# Patient Record
Sex: Male | Born: 1990 | Race: White | Hispanic: No | Marital: Single
Health system: Southern US, Community
[De-identification: ages and names within clinical notes are randomized; demographics above are authoritative.]

---
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
## 2012-01-07 ENCOUNTER — Other Ambulatory Visit: Payer: Self-pay | Admitting: Endocrinology

## 2012-01-07 ENCOUNTER — Ambulatory Visit
Admission: RE | Admit: 2012-01-07 | Discharge: 2012-01-07 | Disposition: A | Discharge status: Home / Self Care | Payer: 59 | Source: Ambulatory Visit | Attending: Endocrinology | Admitting: Endocrinology

## 2012-01-07 DIAGNOSIS — E041 Nontoxic single thyroid nodule: Secondary | ICD-10-CM

## 2016-07-15 ENCOUNTER — Other Ambulatory Visit: Payer: Self-pay | Admitting: Orthopaedic Surgery of the Spine

## 2016-07-15 DIAGNOSIS — M48062 Spinal stenosis, lumbar region with neurogenic claudication: Secondary | ICD-10-CM

## 2016-07-24 ENCOUNTER — Ambulatory Visit
Admission: RE | Admit: 2016-07-24 | Discharge: 2016-07-24 | Disposition: A | Discharge status: Home / Self Care | Payer: BLUE CROSS/BLUE SHIELD | Source: Ambulatory Visit | Attending: Orthopaedic Surgery of the Spine | Admitting: Orthopaedic Surgery of the Spine

## 2016-07-24 DIAGNOSIS — M48062 Spinal stenosis, lumbar region with neurogenic claudication: Secondary | ICD-10-CM

## 2016-07-24 MED ORDER — ONDANSETRON HCL 4 MG/2ML IJ SOLN: 4.0000 mg | Freq: Four times a day (QID) | INTRAMUSCULAR | Status: DC | PRN

## 2016-07-24 MED ORDER — DIAZEPAM 5 MG PO TABS
5.0000 mg | ORAL_TABLET | Freq: Once | ORAL | Status: AC
  Administered 2016-07-24: 5 mg via ORAL

## 2016-07-24 MED ORDER — IOPAMIDOL (ISOVUE-M 200) INJECTION 41%
18.0000 mL | Freq: Once | INTRAMUSCULAR | Status: AC
  Administered 2016-07-24: 15 mL via INTRATHECAL

## 2016-07-24 NOTE — Discharge Instructions (Signed)
Myelogram Discharge Instructions  1. Go home and rest quietly for the next 24 hours.  It is important to lie flat for the next 24 hours.  Get up only to go to the restroom.  You may lie in the bed or on a couch on your back, your stomach, your left side or your right side.  You may have one pillow under your head.  You may have pillows between your knees while you are on your side or under your knees while you are on your back.  2. DO NOT drive today.  Recline the seat as far back as it will go, while still wearing your seat belt, on the way home.  3. You may get up to go to the bathroom as needed.  You may sit up for 10 minutes to eat.  You may resume your normal diet and medications unless otherwise indicated.  Drink lots of extra fluids today and tomorrow.  4. The incidence of headache, nausea, or vomiting is about 5% (one in 20 patients).  If you develop a headache, lie flat and drink plenty of fluids until the headache goes away.  Caffeinated beverages may be helpful.  If you develop severe nausea and vomiting or a headache that does not go away with flat bed rest, call [phone].  5. You may resume normal activities after your 24 hours of bed rest is over; however, do not exert yourself strongly or do any heavy lifting tomorrow. If when you get up you have a headache when standing, go back to bed and force fluids for another 24 hours.  6. Call your physician for a follow-up appointment.  The results of your myelogram will be sent directly to your physician by the following day.  7. If you have any questions or if complications develop after you arrive home, please call [phone].  Discharge instructions have been explained to the patient.  The patient, or the person responsible for the patient, fully understands these instructions.       May resume Sertraline on July 25, 2016, after 10:30 am.

## 2018-03-20 IMAGING — CT CT L SPINE W/ CM
1 of 11 series · 2 of 14 positions shown, 3 images · non-contrast
Comparison: MRI 05/02/2016 and radiography 05/02/2016.

CLINICAL DATA: Skeletal dysplasia. Low back pain with neurogenic
claudication.
TECHNIQUE: Contiguous axial images were obtained through the Lumbar spine after
the intrathecal infusion of infusion. Coronal and sagittal
reconstructions were obtained of the axial image sets.

[Series 3: l spine soft · axial · 0.28mm/px · z∈[+651,+765]mm · 2 of 116 slices shown, 3 images]
[im 39/116  soft-tissue]
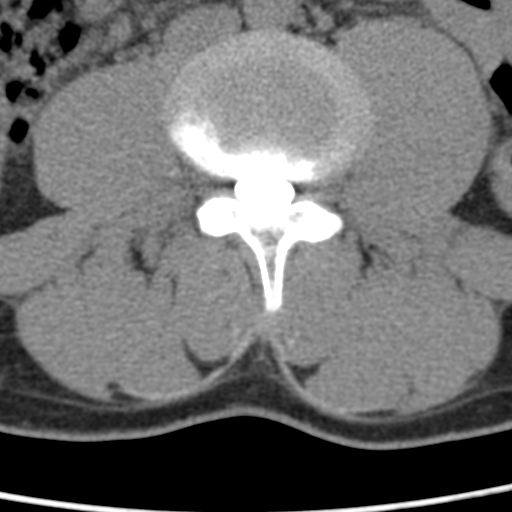
[im 39/116  bone]
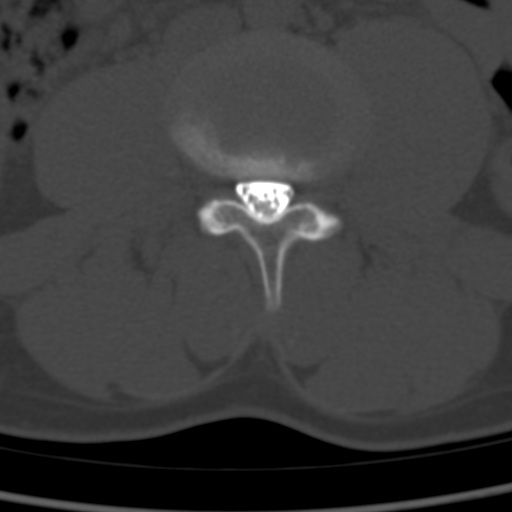
[im 77/116  bone]
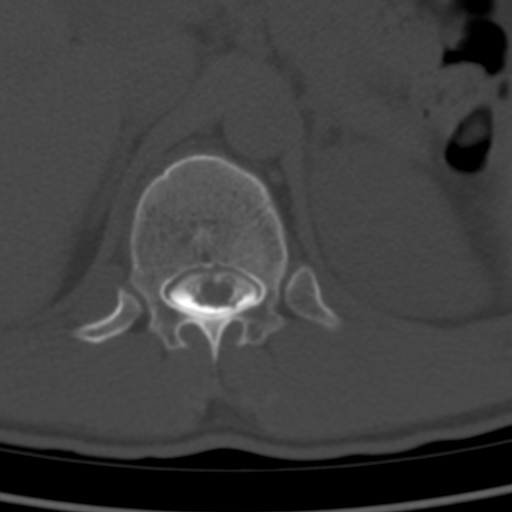

[2 of 14 positions shown; findings below may reference images not displayed]

EXAM:
LUMBAR MYELOGRAM

FLUOROSCOPY TIME:  0 minutes 46 seconds. 189.54 micro gray meter
squared

PROCEDURE:
After thorough discussion of risks and benefits of the procedure
including bleeding, infection, injury to nerves, blood vessels,
adjacent structures as well as headache and CSF leak, written and
oral informed consent was obtained. Consent was obtained by Dr. Algokar
Bregemann. Time out form was completed.

Patient was positioned prone on the fluoroscopy table. Local
anesthesia was provided with 1% lidocaine without epinephrine after
prepped and draped in the usual sterile fashion. Puncture was
performed at L3-4 using a 3 1/2 inch 22-gauge spinal needle via left
para median approach. Using a single pass through the dura, the
needle was placed within the thecal sac, with return of clear CSF.
10 cc of Isovue 200 was injected into the thecal sac, with normal
opacification of the nerve roots and cauda equina consistent with
free flow within the subarachnoid space.

I personally performed the lumbar puncture and administered the
intrathecal contrast. I also personally performed acquisition of the
myelogram images.
FINDINGS: LUMBAR MYELOGRAM FINDINGS:

Thoracolumbar curvature convex to the right and lower lumbar
curvature convex to the left. Central spinal stenosis at T12-L1,
L1-2 and L2-3. Mild bilateral lateral recess narrowing at L3-4 and
L4-5. Standing flexion extension views show 2 mm retrolisthesis at
L2-3, with the most significant stenosis at that level. There is 2
mm anterolisthesis L5-S1. These do not change with bending.

CT LUMBAR MYELOGRAM FINDINGS:

T8-9: No disc abnormality. Short pedicles. Mild bilateral facet
hypertrophy. No compressive stenosis.

T9-10: Chronic endplate Schmorl's nodes. No bulge or herniation.
Short pedicles. Bilateral facet hypertrophy. No compressive
stenosis.

T10-11: Endplate Schmorl's nodes. No disc bulge or herniation. Short
pedicles. Bilateral facet hypertrophy. No compressive stenosis.

T11-12: Superior endplate Schmorl's node. No bulge or herniation.
Short pedicles. Minimal facet hypertrophy. No compressive stenosis.

T12-L1: Mild bulging of the disc. Short pedicles. Mild canal
stenosis without visible neural compression. Facets show sagittal
orientation.

L1-2: Bulging of the disc. Short pedicles. Sagittal facet
orientation. Spinal stenosis with effacement of the subarachnoid
space and crowding of the nerve roots.

L2-3: Stick shallow circumferential protrusion of the disc. Short
pedicles. Sagittal facets with mild hypertrophy and small accessory
ossicles. Spinal stenosis with effacement of the subarachnoid space
and crowding of the nerve roots.

L3-4: Mild bulging of the disc. Short pedicles. Facet hypertrophy
with small accessory ossicles. Mild bilateral lateral recess
narrowing.

L4-5: Bulging of the disc. Short pedicles. Mild facet hypertrophy
with small accessory ossicles. Mild bilateral lateral recess
narrowing.

L5-S1: 2 mm anterolisthesis because of sagittal facets. Mild bulging
of the disc. Facet hypertrophy. Lateral constriction of the spinal
canal. Subarticular lateral recess narrowing left more than right.
IMPRESSION: Curvature convex to the right in the thoracolumbar region and to the
left in the lumbar region.

Skeletal dysplasia with short pedicles, sagittal facet orientation
and small articular process ossicles.

Disc bulging most pronounced at L2-3 followed by L1-2 and T12-L1.
Spinal stenosis at those levels, most severe at the L2-3 level,
followed by L1-2 and T12-L1. Neural compression would be quite
possible, particularly at the L2-3 level.

2 mm of anterolisthesis at L5-S1 that does not change with flexion
or extension.
